# Patient Record
Sex: Female | Born: 1990 | Race: Black or African American | Hispanic: No | Marital: Single | Smoking: Never smoker
Health system: Southern US, Community
[De-identification: ages and names within clinical notes are randomized; demographics above are authoritative.]

## PROBLEM LIST (undated history)

## (undated) HISTORY — PX: TONSILLECTOMY: SUR1361

## (undated) HISTORY — PX: ADENOIDECTOMY: SHX5191

---

## 2013-12-07 ENCOUNTER — Emergency Department (HOSPITAL_COMMUNITY)
Admission: EM | Admit: 2013-12-07 | Discharge: 2013-12-07 | Disposition: A | Discharge status: Home / Self Care | Payer: BC Managed Care – PPO | Attending: Emergency Medicine | Admitting: Emergency Medicine

## 2013-12-07 ENCOUNTER — Encounter (HOSPITAL_COMMUNITY): Payer: Self-pay | Admitting: Emergency Medicine

## 2013-12-07 ENCOUNTER — Emergency Department (HOSPITAL_COMMUNITY): Payer: BC Managed Care – PPO

## 2013-12-07 DIAGNOSIS — M79606 Pain in leg, unspecified: Secondary | ICD-10-CM

## 2013-12-07 DIAGNOSIS — S8990XA Unspecified injury of unspecified lower leg, initial encounter: Secondary | ICD-10-CM | POA: Insufficient documentation

## 2013-12-07 DIAGNOSIS — S79919A Unspecified injury of unspecified hip, initial encounter: Secondary | ICD-10-CM | POA: Insufficient documentation

## 2013-12-07 DIAGNOSIS — S79929A Unspecified injury of unspecified thigh, initial encounter: Principal | ICD-10-CM

## 2013-12-07 DIAGNOSIS — T148XXA Other injury of unspecified body region, initial encounter: Secondary | ICD-10-CM | POA: Insufficient documentation

## 2013-12-07 DIAGNOSIS — Y9389 Activity, other specified: Secondary | ICD-10-CM | POA: Insufficient documentation

## 2013-12-07 DIAGNOSIS — S99919A Unspecified injury of unspecified ankle, initial encounter: Secondary | ICD-10-CM

## 2013-12-07 DIAGNOSIS — S99929A Unspecified injury of unspecified foot, initial encounter: Secondary | ICD-10-CM

## 2013-12-07 DIAGNOSIS — IMO0002 Reserved for concepts with insufficient information to code with codable children: Secondary | ICD-10-CM | POA: Insufficient documentation

## 2013-12-07 DIAGNOSIS — Y92410 Unspecified street and highway as the place of occurrence of the external cause: Secondary | ICD-10-CM | POA: Insufficient documentation

## 2013-12-07 MED ORDER — HYDROCODONE-ACETAMINOPHEN 5-325 MG PO TABS: 2.0000 | ORAL_TABLET | ORAL | Status: AC | PRN
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
## 2013-12-07 NOTE — Progress Notes (Signed)
[facility] provided pt with a list of primary care resources and [facility] information.

## 2013-12-08 NOTE — ED Provider Notes (Signed)
Medical screening examination/treatment/procedure(s) were performed by non-physician practitioner and as supervising physician I was immediately available for consultation/collaboration.  EKG Interpretation   None         Junius ArgyleForrest S Makita Blow, MD 12/08/13 1137
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
